# Patient Record
Sex: Male | Born: 1990 | Hispanic: Yes | Marital: Married | Smoking: Former smoker
Health system: Southern US, Community
[De-identification: ages and names within clinical notes are randomized; demographics above are authoritative.]

---

## 2015-03-04 ENCOUNTER — Encounter (HOSPITAL_COMMUNITY): Payer: Self-pay | Admitting: *Deleted

## 2015-03-04 ENCOUNTER — Emergency Department (HOSPITAL_COMMUNITY): Payer: Self-pay

## 2015-03-04 ENCOUNTER — Emergency Department (HOSPITAL_COMMUNITY)
Admission: EM | Admit: 2015-03-04 | Discharge: 2015-03-04 | Disposition: A | Discharge status: Home / Self Care | Payer: Self-pay | Attending: Emergency Medicine | Admitting: Emergency Medicine

## 2015-03-04 DIAGNOSIS — M6281 Muscle weakness (generalized): Secondary | ICD-10-CM

## 2015-03-04 DIAGNOSIS — W458XXA Other foreign body or object entering through skin, initial encounter: Secondary | ICD-10-CM | POA: Insufficient documentation

## 2015-03-04 DIAGNOSIS — Y9289 Other specified places as the place of occurrence of the external cause: Secondary | ICD-10-CM | POA: Insufficient documentation

## 2015-03-04 DIAGNOSIS — Z23 Encounter for immunization: Secondary | ICD-10-CM | POA: Insufficient documentation

## 2015-03-04 DIAGNOSIS — Y9389 Activity, other specified: Secondary | ICD-10-CM | POA: Insufficient documentation

## 2015-03-04 DIAGNOSIS — Y998 Other external cause status: Secondary | ICD-10-CM | POA: Insufficient documentation

## 2015-03-04 DIAGNOSIS — Z87891 Personal history of nicotine dependence: Secondary | ICD-10-CM | POA: Insufficient documentation

## 2015-03-04 DIAGNOSIS — R29898 Other symptoms and signs involving the musculoskeletal system: Secondary | ICD-10-CM

## 2015-03-04 DIAGNOSIS — S61411A Laceration without foreign body of right hand, initial encounter: Secondary | ICD-10-CM | POA: Insufficient documentation

## 2015-03-04 MED ORDER — HYDROCODONE-ACETAMINOPHEN 5-325 MG PO TABS: 2.0000 | ORAL_TABLET | ORAL | Status: AC | PRN

## 2015-03-04 MED ORDER — CEPHALEXIN 500 MG PO CAPS: 500.0000 mg | ORAL_CAPSULE | Freq: Three times a day (TID) | ORAL | Status: AC

## 2015-03-04 MED ORDER — CEFAZOLIN SODIUM 1 G IJ SOLR: 2.0000 g | Freq: Once | INTRAMUSCULAR | Status: DC

## 2015-03-04 MED ORDER — CEFAZOLIN SODIUM 1-5 GM-% IV SOLN
1.0000 g | Freq: Once | INTRAVENOUS | Status: AC
  Administered 2015-03-04: 1 g via INTRAVENOUS
  Filled 2015-03-04: qty 50

## 2015-03-04 MED ORDER — TETANUS-DIPHTH-ACELL PERTUSSIS 5-2.5-18.5 LF-MCG/0.5 IM SUSP
0.5000 mL | Freq: Once | INTRAMUSCULAR | Status: AC
Start: 2015-03-04 — End: 2015-03-04
  Administered 2015-03-04: 0.5 mL via INTRAMUSCULAR
  Filled 2015-03-04: qty 0.5

## 2015-03-04 MED ORDER — LIDOCAINE-EPINEPHRINE (PF) 1 %-1:200000 IJ SOLN
INTRAMUSCULAR | Status: AC
  Filled 2015-03-04: qty 10

## 2015-03-04 MED ORDER — LIDOCAINE HCL (PF) 1 % IJ SOLN
INTRAMUSCULAR | Status: AC
  Filled 2015-03-04: qty 10

## 2015-03-04 NOTE — ED Notes (Signed)
Wound to right palm dressed with ABD pad and 4X4's for pressure dressing and wrapped with kling, secured with tape after stiches placed by EDP and cleaned with iodine and saline.

## 2015-03-04 NOTE — Discharge Instructions (Signed)
Watch for signs of infection including fevers, spreading redness, pus draining. Take antibiotics as discussed Take tylenol and ibuprofen for pain, For severe pain take norco or vicodin however realize they have the potential for addiction and it can make you sleepy and has tylenol in it.  No operating machinery while taking.  If you were given medicines take as directed.  If you are on coumadin or contraceptives realize their levels and effectiveness is altered by many different medicines.  If you have any reaction (rash, tongues swelling, other) to the medicines stop taking and see a physician.    If your blood pressure was elevated in the ER make sure you follow up for management with a primary doctor or return for chest pain, shortness of breath or stroke symptoms.  Please follow up as directed and return to the ER or see a physician for new or worsening symptoms.  Thank you. Filed Vitals:   03/04/15 1928 03/04/15 2024  BP: 161/93 159/76  Pulse: 93 86  Temp: 98.8 F (37.1 C)   TempSrc: Oral   Resp: 20 14  Height: 5\' 10"  (1.778 m)   Weight: 200 lb (90.719 kg)   SpO2: 98% 99%

## 2015-03-04 NOTE — ED Notes (Signed)
Discharge instructions given, pt demonstrated teach back and verbal understanding about follow up with surgeon and wound care. No concerns voiced.

## 2015-03-04 NOTE — ED Notes (Signed)
Pt was cutting corn on the cob when the knife slipped cutting palm of right hand near thumb area, bleeding is not controlled at triage, pressure dressing applied,

## 2015-03-04 NOTE — ED Provider Notes (Signed)
CSN: [id number]     Arrival date & time 03/04/15  1918 History   First MD Initiated Contact with Patient 03/04/15 1933     Chief Complaint  Patient presents with  . Laceration     (Consider location/radiation/quality/duration/timing/severity/associated sxs/prior Treatment) HPI Comments: 24 year old male with no significant medical history presents with significant laceration to the right palm. Patient was cutting corn in slice deep into his right hand. Significant bleeding difficult to control. No other injuries. Tetanus shot not up-to-date. Patient has weakness and numbness to the thumb. Pain with palpation  Patient is a 24 y.o. male presenting with skin laceration. The history is provided by the patient.  Laceration   History reviewed. No pertinent past medical history. History reviewed. No pertinent past surgical history. No family history on file. History  Substance Use Topics  . Smoking status: Former Games developer  . Smokeless tobacco: Not on file  . Alcohol Use: No    Review of Systems  Constitutional: Negative for fever and chills.  HENT: Negative for congestion.   Eyes: Negative for visual disturbance.  Respiratory: Negative for shortness of breath.   Cardiovascular: Negative for chest pain.  Gastrointestinal: Negative for vomiting and abdominal pain.  Genitourinary: Negative for dysuria and flank pain.  Musculoskeletal: Negative for back pain, neck pain and neck stiffness.  Skin: Positive for wound. Negative for rash.  Neurological: Positive for weakness and numbness. Negative for light-headedness and headaches.      Allergies  Review of patient's allergies indicates no known allergies.  Home Medications   Prior to Admission medications   Medication Sig Start Date End Date Taking? Authorizing Provider  cephALEXin (KEFLEX) 500 MG capsule Take 1 capsule (500 mg total) by mouth 3 (three) times daily. 03/04/15   Blane Ohara, MD  HYDROcodone-acetaminophen (NORCO) 5-325  MG per tablet Take 2 tablets by mouth every 4 (four) hours as needed. 03/04/15   Blane Ohara, MD   BP 159/76 mmHg  Pulse 86  Temp(Src) 98.8 F (37.1 C) (Oral)  Resp 14  Ht  (1.778 m)  Wt 200 lb (90.719 kg)  BMI 28.70 kg/m2  SpO2 99% Physical Exam  Constitutional: He appears well-developed and well-nourished.  HENT:  Head: Normocephalic and atraumatic.  Eyes: Conjunctivae are normal.  Neck: Neck supple.  Cardiovascular: Normal rate.   Pulmonary/Chest: Effort normal.  Musculoskeletal: He exhibits edema and tenderness.  Patient has 6 cm deep laceration through superficial skin adipose and superficial muscle between the thumb and pointer finger web space and palmar aspect on the right hand. Patient has 5+ strength with flexion and extension of PIP and DIP to all 4 fingers. Patient has weakness with flexion of DIP and opposition with the thumb. Patient has decreased sensation distal laceration especially on the thumb palmar aspect. Patient has good flexion and extension of the thumb.  Skin: Skin is warm.  Nursing note and vitals reviewed.   ED Course  Procedures (including critical care time) LACERATION REPAIR Performed by: Enid Skeens Authorized by: Enid Skeens Consent: Verbal consent obtained. Risks and benefits: risks, benefits and alternatives were discussed Consent given by: patient Patient identity confirmed: provided demographic data Prepped and Draped in normal sterile fashion Wound explored  Laceration Location: Right hand web space Laceration Length: 7cm No Foreign Bodies seen or palpated Anesthesia: local infiltration Local anesthetic: lidocaine 1% Anesthetic total: 10 ml Amount of cleaning: standard Betadine   Skin closure: approximated Number of sutures: 9   deep wound, multiple sutures, muscle involvement,  complicated laceration, weakness to the thumb before and after suture Technique: Interrupted, ethyl   Patient tolerance: Patient tolerated  the procedure well with no immediate complications.   Labs Review Labs Reviewed - No data to display  Imaging Review No results found.   EKG Interpretation None      MDM   Final diagnoses:  Hand laceration, right, initial encounter  Thumb weakness   Patient presents with complex laceration to the palmar aspect web space. Weakness and numbness with opposition of the thumb. Wound cleaned in ER. Discussed case with hand surgeon on call Dr. Izora Ribasoley who will follow the patient closely outpatient. Agrees with oral antibiotics.  Results and differential diagnosis were discussed with the patient/parent/guardian. Close follow up outpatient was discussed, comfortable with the plan.   Medications  lidocaine-EPINEPHrine (XYLOCAINE-EPINEPHrine) 1 %-1:200000 (PF) injection (not administered)  lidocaine (PF) (XYLOCAINE) 1 % injection (not administered)  Tdap (BOOSTRIX) injection 0.5 mL (not administered)  ceFAZolin (ANCEF) IVPB 1 g/50 mL premix (not administered)    Filed Vitals:   03/04/15 1928 03/04/15 2024  BP: 161/93 159/76  Pulse: 93 86  Temp: 98.8 F (37.1 C)   TempSrc: Oral   Resp: 20 14  Height: 5\' 10"  (1.778 m)   Weight: 200 lb (90.719 kg)   SpO2: 98% 99%    Final diagnoses:  Hand laceration, right, initial encounter  Thumb weakness       Blane OharaJoshua Dio Giller, MD 03/04/15 2101

## 2015-12-01 IMAGING — DX DG HAND COMPLETE 3+V*R*
3 series · 3 of 3 positions shown · non-contrast
Comparison: None.

CLINICAL DATA: Laceration of the palm.

EXAM:
RIGHT HAND - COMPLETE 3+ VIEW

[hand pa]
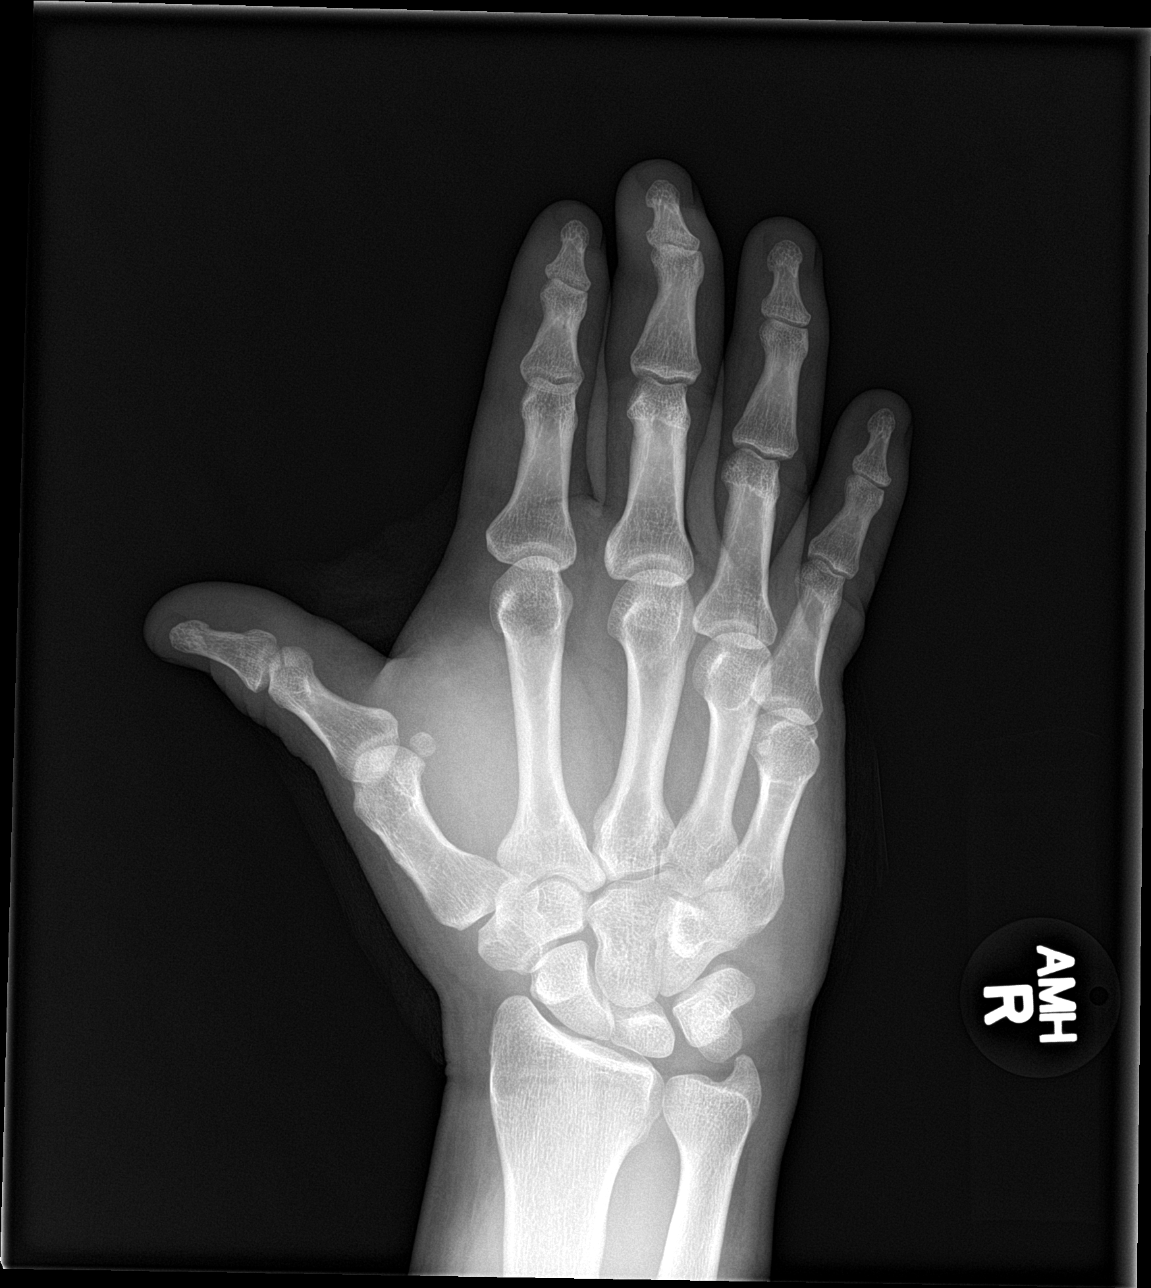

[hand obl]
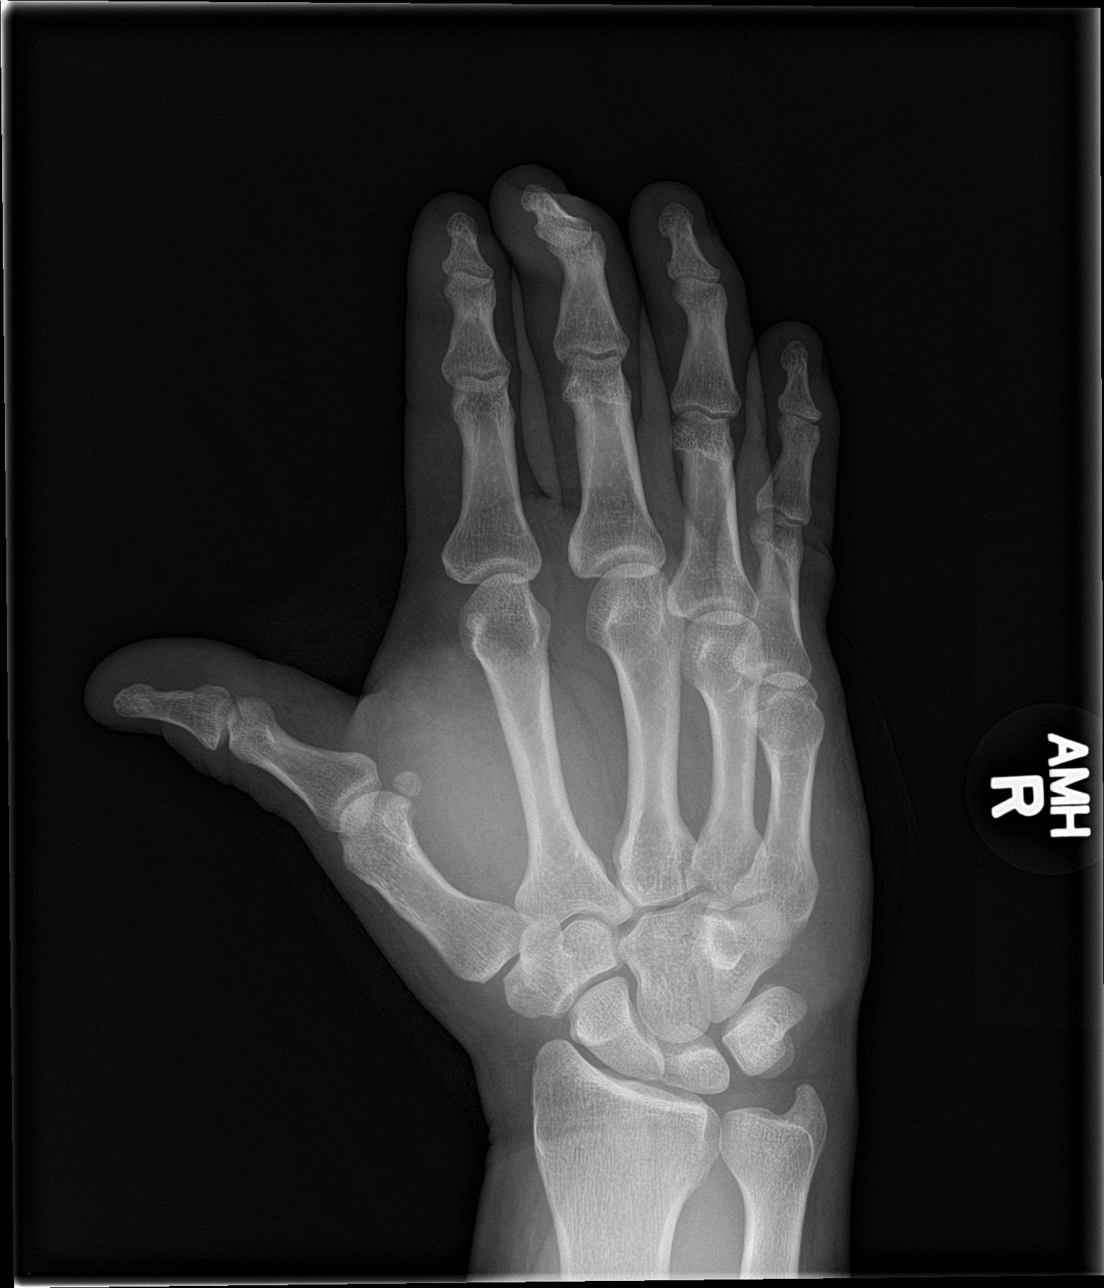

[hand lat]
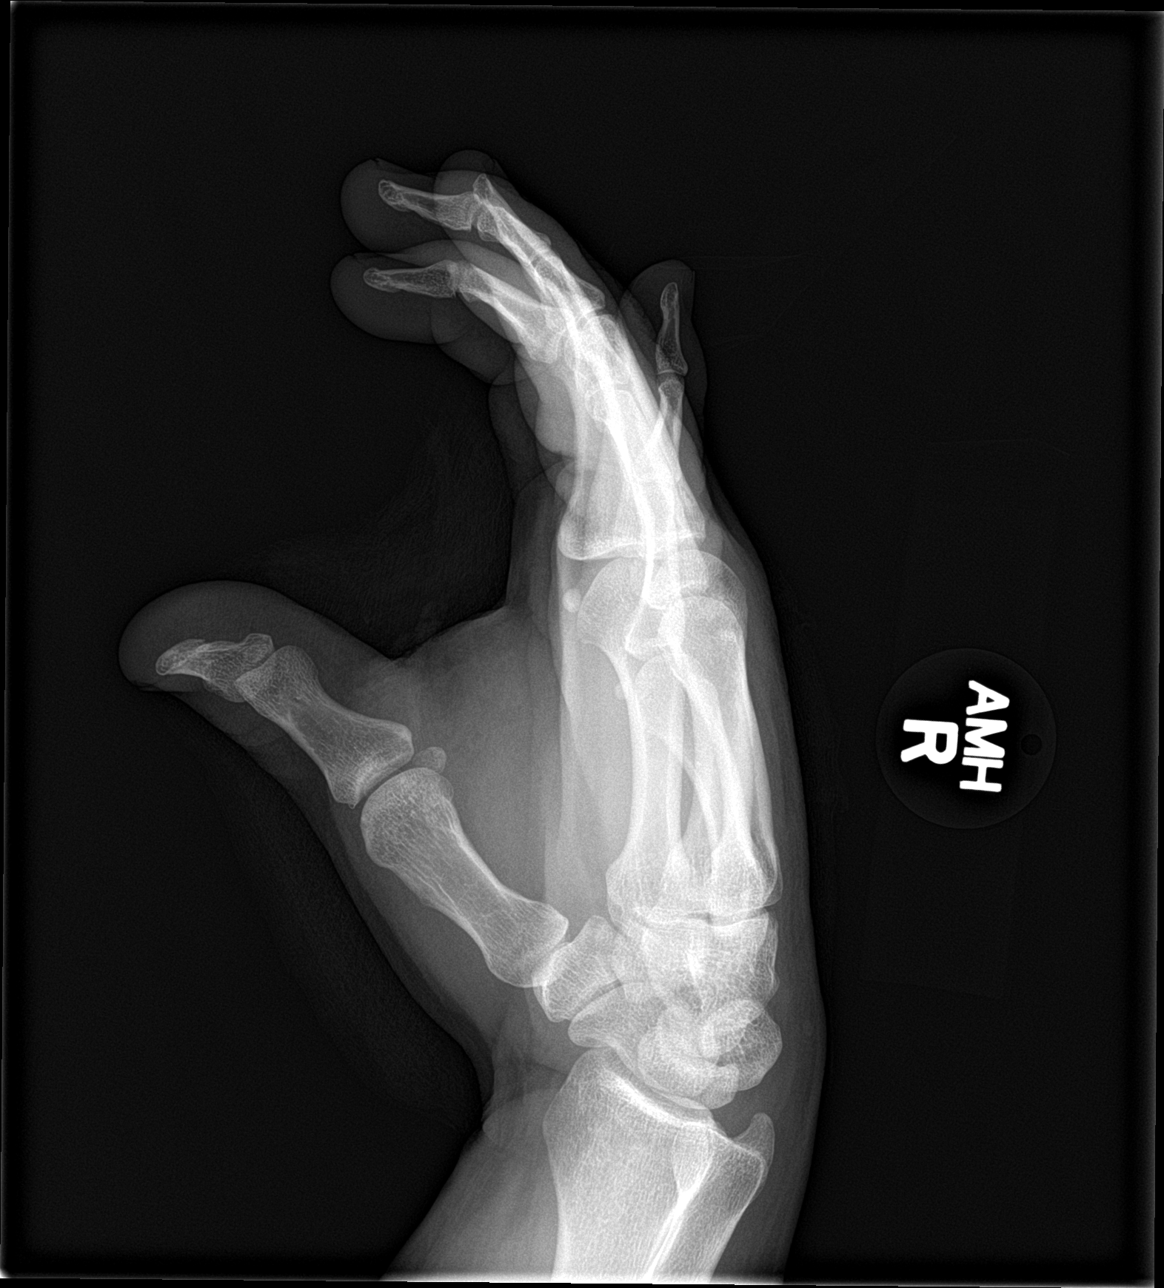

[3 of 3 positions shown; findings below may reference images not displayed]

FINDINGS: There is no evidence of fracture or dislocation. There is no
evidence of arthropathy or other focal bone abnormality. Soft
tissues are unremarkable.
IMPRESSION: Negative.
